# Patient Record
Sex: Female | Born: 2000 | Hispanic: No | Marital: Single | Smoking: Never smoker
Health system: Southern US, Community
[De-identification: ages and names within clinical notes are randomized; demographics above are authoritative.]

---
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
## 2023-03-16 ENCOUNTER — Encounter (HOSPITAL_BASED_OUTPATIENT_CLINIC_OR_DEPARTMENT_OTHER): Payer: Self-pay | Admitting: Urology

## 2023-03-16 ENCOUNTER — Emergency Department (HOSPITAL_BASED_OUTPATIENT_CLINIC_OR_DEPARTMENT_OTHER)
Admission: EM | Admit: 2023-03-16 | Discharge: 2023-03-16 | Disposition: A | Discharge status: Home / Self Care | Payer: Medicaid Other | Attending: Emergency Medicine | Admitting: Emergency Medicine

## 2023-03-16 ENCOUNTER — Other Ambulatory Visit: Payer: Self-pay

## 2023-03-16 DIAGNOSIS — L720 Epidermal cyst: Secondary | ICD-10-CM | POA: Diagnosis not present

## 2023-03-16 DIAGNOSIS — M542 Cervicalgia: Secondary | ICD-10-CM | POA: Diagnosis present

## 2023-03-16 NOTE — ED Triage Notes (Signed)
Pt states noticed a "lump" under left jaw that was noticed this afternoon No redness noted  Denies any fever

## 2023-03-16 NOTE — ED Provider Notes (Signed)
[facility] AT [facility] Provider Note   CSN: [id number] Arrival date & time: 03/16/23  1823     History  Chief Complaint  Patient presents with   Mass    Nicole Bolton is a 22 y.o. female without significant past medical history presents to the ED complaining of a painful "lump" on her neck near her jaw.  Patient states she woke up from a nap and noticed the mass.  Denies recent illness, redness, fever, swelling, increased warmth, drainage, dental problem, difficulty breathing or swallowing.         Home Medications Prior to Admission medications   Medication Sig Start Date End Date Taking? Authorizing Provider  hydrOXYzine (ATARAX/VISTARIL) 10 MG tablet Take 1 tablet (10 mg total) by mouth every 6 (six) hours as needed for itching. 03/05/15   Antony Madura, PA-C  Multiple Vitamin (MULTIVITAMIN WITH MINERALS) TABS tablet Take 1 tablet by mouth daily.    [provider]      Allergies    Patient has no known allergies.    Review of Systems   Review of Systems  Constitutional:  Negative for fever.  HENT:  Negative for dental problem, facial swelling and trouble swallowing.        Hard "lump" on left neck near jawline  Respiratory:  Negative for shortness of breath.     Physical Exam Updated Vital Signs BP 118/81 (BP Location: Left Arm)   Pulse 79   Temp 97.7 F (36.5 C)   Resp 18   Ht 5\' 7"  (1.702 m)   Wt 94 kg   LMP 03/15/2023   SpO2 97%   BMI 32.45 kg/m  Physical Exam Vitals and nursing note reviewed.  Constitutional:      General: She is not in acute distress.    Appearance: Normal appearance. She is not ill-appearing or diaphoretic.  HENT:     Head:      Comments: Approximately 1 cm firm, non-mobile mass near the angle of the mandible on the left.  Mass is tender.  Increased pain with lateral neck rotation.  No swelling along the jawline.  No erythema or increased warmth.      Mouth/Throat:     Lips: Pink.      Mouth: Mucous membranes are moist.     Pharynx: Oropharynx is clear. Uvula midline.  Cardiovascular:     Rate and Rhythm: Normal rate and regular rhythm.  Pulmonary:     Effort: Pulmonary effort is normal.  Musculoskeletal:     Cervical back: Full passive range of motion without pain and neck supple.  Lymphadenopathy:     Cervical: No cervical adenopathy.  Skin:    General: Skin is warm and dry.     Capillary Refill: Capillary refill takes less than 2 seconds.  Neurological:     Mental Status: She is alert. Mental status is at baseline.  Psychiatric:        Mood and Affect: Mood normal.        Behavior: Behavior normal.     ED Results / Procedures / Treatments   Labs (all labs ordered are listed, but only abnormal results are displayed) Labs Reviewed - No data to display  EKG None  Radiology No results found.  Procedures Ultrasound ED Soft Tissue  Date/Time: 03/16/2023 7:25 PM  Performed by: Lenard Simmer, PA-C Authorized by: Lenard Simmer, PA-C   Procedure details:    Indications: localization of abscess  Transverse view:  Visualized   Longitudinal view:  Visualized   Images: not archived   Location:    Location: neck     Side:  Left Findings:     no abscess present    no cellulitis present Comments:     Approximately 1 cm oval mass that is uniformly hypoechoic.      Medications Ordered in ED Medications - No data to display  ED Course/ Medical Decision Making/ A&P                             Medical Decision Making  This patient presents to the ED with chief complaint(s) of mass in left neck with non-contributory past medical history. The complaint involves an extensive differential diagnosis and also carries with it a high risk of complications and morbidity.    The differential diagnosis includes simple cyst, abscess, lymphadenopathy   Initial Assessment:   Exam significant for approximately 1 cm firm, non-mobile, tender mass on the left  neck near the angle of the mandible.  No surrounding erythema, increased warmth, fluctuance, or facial swelling.  Normal ROM of the neck.  Patient speaking in full sentences and able to swallow without difficulty.  She is afebrile.   Treatment and Reassessment: Bedside ultrasound performed, see procedure section for more detail.  Suspect mass is a simple cyst.    Disposition:   Will provide patient with [facility] phone number for primary care follow-up.  Recommended NSAIDs if area remains painful.  Discussed warning signs of infection with patient.  The patient has been appropriately medically screened and/or stabilized in the ED. I have low suspicion for any other emergent medical condition which would require further screening, evaluation or treatment in the ED or require inpatient management. At time of discharge the patient is hemodynamically stable and in no acute distress. I have discussed work-up results and diagnosis with patient and answered all questions. Patient is agreeable with discharge plan. We discussed strict return precautions for returning to the emergency department and they verbalized understanding.           Final Clinical Impression(s) / ED Diagnoses Final diagnoses:  Epidermal cyst    Rx / DC Orders ED Discharge Orders     None         Lenard Simmer, PA-C 03/16/23 1934    Benjiman Core, MD 03/16/23 2333

## 2023-03-16 NOTE — Discharge Instructions (Addendum)
Thank you for allowing me to be a part of your care today.    The bedside ultrasound that was done while you were in the ED shows a small, fluid filled area consistent with a cyst.  I recommend taking ibuprofen if this area is uncomfortable/painful.  I recommend following up with your primary care provider.  I have provided the [facility] phone number to help you establish primary care.  Please call the number provided and they will assist you in scheduling an appointment.    Return to the ED if you develop redness, swelling, increased warmth, increased pain or fever which would suggest infection.

## 2023-03-26 ENCOUNTER — Other Ambulatory Visit: Payer: Self-pay

## 2023-03-26 ENCOUNTER — Encounter (HOSPITAL_BASED_OUTPATIENT_CLINIC_OR_DEPARTMENT_OTHER): Payer: Self-pay | Admitting: Emergency Medicine

## 2023-03-26 ENCOUNTER — Emergency Department (HOSPITAL_BASED_OUTPATIENT_CLINIC_OR_DEPARTMENT_OTHER): Payer: Medicaid Other | Admitting: Radiology

## 2023-03-26 ENCOUNTER — Emergency Department (HOSPITAL_BASED_OUTPATIENT_CLINIC_OR_DEPARTMENT_OTHER)
Admission: EM | Admit: 2023-03-26 | Discharge: 2023-03-26 | Disposition: A | Discharge status: Home / Self Care | Payer: Medicaid Other | Attending: Emergency Medicine | Admitting: Emergency Medicine

## 2023-03-26 DIAGNOSIS — S93401A Sprain of unspecified ligament of right ankle, initial encounter: Secondary | ICD-10-CM | POA: Insufficient documentation

## 2023-03-26 DIAGNOSIS — X501XXA Overexertion from prolonged static or awkward postures, initial encounter: Secondary | ICD-10-CM | POA: Diagnosis not present

## 2023-03-26 DIAGNOSIS — M25571 Pain in right ankle and joints of right foot: Secondary | ICD-10-CM | POA: Diagnosis present

## 2023-03-26 NOTE — Discharge Instructions (Signed)
You were evaluated today for right ankle pain.  Your presentation is consistent with a right ankle sprain.  Please use the walking boot and crutches to reduce weightbearing on the right lower extremity as you are able.  As your symptoms improve you may discontinue the crutches and remove the boot.  If you fail to see improvement over the next few weeks please follow-up with orthopedics.  You may take Tylenol and ibuprofen at home.  He may take 600 mg of ibuprofen every 6 hours and up to 1000 mg of Tylenol every 6 hours.  Do not exceed these amounts.  If you develop any life-threatening symptoms please return to the emergency department.

## 2023-03-26 NOTE — ED Triage Notes (Signed)
Pt arrives to ED with c/o right ankle pain after twisting her ankle yesterday.

## 2023-03-26 NOTE — ED Notes (Signed)
Patient verbalizes understanding of discharge instructions. Opportunity for questioning and answers were provided. Patient discharged from ED.  °

## 2023-03-26 NOTE — ED Provider Notes (Signed)
EMERGENCY DEPARTMENT AT [facility] Provider Note   CSN: [id number] Arrival date & time: 03/26/23  1559     History  Chief Complaint  Patient presents with   Ankle Pain    Nicole Bolton is a 22 y.o. female.  Patient presents to the emergency room complaining of right ankle pain secondary to "rolling" her right ankle last night.  Patient states she was walking through the house when she slipped on a dumbbell that was in the floor, injuring the right ankle in the process.  She denies hitting her head and denies losing consciousness.  She states it is painful to walk on the right ankle at this time.  Past medical history is noncontributory  HPI     Home Medications Prior to Admission medications   Medication Sig Start Date End Date Taking? Authorizing Provider  hydrOXYzine (ATARAX/VISTARIL) 10 MG tablet Take 1 tablet (10 mg total) by mouth every 6 (six) hours as needed for itching. 03/05/15   Antony Madura, PA-C  Multiple Vitamin (MULTIVITAMIN WITH MINERALS) TABS tablet Take 1 tablet by mouth daily.    [provider]      Allergies    Patient has no known allergies.    Review of Systems   Review of Systems  Physical Exam Updated Vital Signs BP (!) 130/96 (BP Location: Right Arm)   Pulse 89   Temp 97.6 F (36.4 C) (Temporal)   Resp 15   Ht 5\' 7"  (1.702 m)   Wt 94 kg   LMP 03/15/2023   SpO2 98%   BMI 32.45 kg/m  Physical Exam Vitals and nursing note reviewed.  HENT:     Head: Normocephalic and atraumatic.  Eyes:     Pupils: Pupils are equal, round, and reactive to light.  Pulmonary:     Effort: Pulmonary effort is normal. No respiratory distress.  Musculoskeletal:        General: Swelling, tenderness and signs of injury present. No deformity.     Cervical back: Normal range of motion.     Comments: Patient with tenderness to palpation of the lateral right malleolus, swelling noted to the lateral right ankle.  Patient able to move  ankle through normal range of motion but does complain of pain  Skin:    General: Skin is dry.  Neurological:     Mental Status: She is alert.  Psychiatric:        Speech: Speech normal.        Behavior: Behavior normal.     ED Results / Procedures / Treatments   Labs (all labs ordered are listed, but only abnormal results are displayed) Labs Reviewed - No data to display  EKG None  Radiology DG Ankle Complete Right  Result Date: 03/26/2023 CLINICAL DATA:  Right ankle pain after injury yesterday. EXAM: RIGHT ANKLE - COMPLETE 3+ VIEW COMPARISON:  None Available. FINDINGS: There is no evidence of fracture, dislocation, or joint effusion. There is no evidence of arthropathy or other focal bone abnormality. Soft tissues are unremarkable. IMPRESSION: Negative. Electronically Signed   By: Lupita Raider M.D.   On: 03/26/2023 16:29    Procedures .Ortho Injury Treatment  Date/Time: 03/26/2023 5:27 PM  Performed by: Darrick Grinder, PA-C Authorized by: Darrick Grinder, PA-C   Consent:    Consent obtained:  Verbal   Consent given by:  Patient   Risks discussed:  Restricted joint movement and stiffness   Alternatives discussed:  No treatment and alternative  treatmentInjury location: ankle Location details: right ankle Injury type: soft tissue Pre-procedure neurovascular assessment: neurovascularly intact Immobilization: CAM Walker boot with crutches. Splint Applied by: ED Tech Post-procedure neurovascular assessment: post-procedure neurovascularly intact       Medications Ordered in ED Medications - No data to display  ED Course/ Medical Decision Making/ A&P                             Medical Decision Making Amount and/or Complexity of Data Reviewed Radiology: ordered.   Patient presents to the emergency department complaining of right ankle pain secondary to a fall.  Differential diagnosis includes fracture, dislocation, sprain, and others  I ordered and  interpreted imaging including plain films of the right ankle which showed no acute fracture or dislocation.  Patient's presentation is consistent with an ankle sprain.  Patient was provided a cam walker boot and crutches for ambulation.  Plan to discharge home with plans for orthopedic follow-up as needed, over-the-counter anti-inflammatories and Tylenol, ice, and reduce weightbearing on the right lower extremity until symptoms improve.        Final Clinical Impression(s) / ED Diagnoses Final diagnoses:  Sprain of right ankle, unspecified ligament, initial encounter    Rx / DC Orders ED Discharge Orders     None         Pamala Duffel 03/26/23 1729    Terrilee Files, MD 03/27/23 1027
